# Patient Record
Sex: Male | Born: 1960 | Race: Black or African American | Hispanic: No | Marital: Married
Health system: Southern US, Community
[De-identification: ages and names within clinical notes are randomized; demographics above are authoritative.]

---

## 2004-09-13 ENCOUNTER — Encounter: Admission: RE | Admit: 2004-09-13 | Discharge: 2004-09-13 | Payer: Self-pay | Admitting: *Deleted

## 2004-10-14 ENCOUNTER — Ambulatory Visit (HOSPITAL_COMMUNITY): Admission: RE | Admit: 2004-10-14 | Discharge: 2004-10-14 | Payer: Self-pay | Admitting: Neurosurgery

## 2006-07-13 ENCOUNTER — Ambulatory Visit: Payer: Self-pay | Admitting: Internal Medicine

## 2006-07-31 ENCOUNTER — Ambulatory Visit: Payer: Self-pay | Admitting: Internal Medicine

## 2008-01-07 ENCOUNTER — Emergency Department (HOSPITAL_COMMUNITY): Admission: EM | Admit: 2008-01-07 | Discharge: 2008-01-07 | Payer: Self-pay | Admitting: Emergency Medicine

## 2008-09-06 ENCOUNTER — Emergency Department (HOSPITAL_COMMUNITY): Admission: EM | Admit: 2008-09-06 | Discharge: 2008-09-07 | Payer: Self-pay | Admitting: Emergency Medicine

## 2009-09-08 ENCOUNTER — Ambulatory Visit: Payer: Self-pay | Admitting: Internal Medicine

## 2009-09-08 DIAGNOSIS — B356 Tinea cruris: Secondary | ICD-10-CM

## 2009-09-08 DIAGNOSIS — L851 Acquired keratosis [keratoderma] palmaris et plantaris: Secondary | ICD-10-CM

## 2009-09-08 DIAGNOSIS — H524 Presbyopia: Secondary | ICD-10-CM

## 2009-09-08 DIAGNOSIS — R300 Dysuria: Secondary | ICD-10-CM | POA: Insufficient documentation

## 2009-09-08 LAB — CONVERTED CEMR LAB
Blood in Urine, dipstick: NEGATIVE
Glucose, Urine, Semiquant: NEGATIVE
Nitrite: NEGATIVE
Protein, U semiquant: NEGATIVE
Specific Gravity, Urine: <1.005
WBC Urine, dipstick: NEGATIVE

## 2009-11-09 ENCOUNTER — Ambulatory Visit: Payer: Self-pay | Admitting: Internal Medicine

## 2010-02-15 ENCOUNTER — Encounter: Admission: RE | Admit: 2010-02-15 | Discharge: 2010-02-15 | Payer: Self-pay | Admitting: Otolaryngology

## 2010-10-05 NOTE — Assessment & Plan Note (Signed)
Summary: BODY ITCHING/ BURNING/ GK   Vital Signs:  Patient profile:   50 year old male Height:      67 inches Weight:      176 pounds BMI:     27.67 Temp:     97.6 degrees F oral Pulse rate:   63 / minute Pulse rhythm:   regular Resp:     18 per minute BP sitting:   121 / 77  (left arm) Cuff size:   regular  Vitals Entered By: Armenia Shannon (September 08, 2009 9:00 AM) CC: np.....[name] pt says he is itching on his anus...Gabriel KitchenMarland James pt feels as if he is losing sight.... Is Patient Diabetic? No Pain Assessment Patient in pain? no       Does patient need assistance? Functional Status Self care Ambulation Normal  Vision Screening:Left eye w/o correction: 20 / 13 Right Eye w/o correction: 20 / 20-1 Both eyes w/o correction:  20/ 13        Vision Entered By: Armenia Shannon (September 08, 2009 9:03 AM)   CC:  np.....[name] pt says he is itching on his anus...Gabriel KitchenMarland James pt feels as if he is losing sight.....  History of Present Illness: 1.  Anal itching:  chronic condition--Before 2007.  Goes to [facility] in [place].  Pt. has a history of flaking rash--felt to be tinea cruris in 07/2006 and treated with Nizoral cream.  Has not been seen here since, but went to [facility] and given Nystatin and Triamcinolone cream--helps control.  Using just when has symptoms. States just has to use for a few days and goes away.  Has used the cream for a month solid previously.  2.  Problems with vision:  has to read a lot for school.  Using reading glasses for 1 year--helps.  Describes having to hold books away from face.  Distance vision has been okay.  Using 1.75 reading glasses.  3.  Urinary burning:  very slight--shaft of penis for past 2 days.  No rash on penis.  No frequency or incomplete emptying.  No testicular discomfort.  No fever or hematuria.  No penile discharge.  Monogamous with wife.  Current Medications (verified): 1)  None  Allergies (verified): No Known Drug  Allergies  Physical Exam  General:  NAD Eyes:  No corneal or conjunctival inflammation noted. EOMI. Perrla. Funduscopic exam benign, without hemorrhages, exudates or papilledema. Vision grossly normal. Rectal:  Perianal area with hyperpigmentation and some flaking on perineum and perianal area extending up between buttocks.  Posterior to anus, with some moistness--clear and fissuring, pinkening of skin.  Has more flaky satellite lesion about  quarter sized on left buttock adjancent to perianal involvement  Genitalia:  Testes bilaterally descended without nodularity, tenderness or masses. No scrotal masses or lesions. No penis lesions or urethral discharge.circumcised.   Skin:  Generalized skin dryness and flaking.   Impression & Recommendations:  Problem # 1:  DRY SKIN (ICD-701.1) Eucerin Cream Dove soap  Problem # 2:  TINEA CRURIS (ICD-110.3) Ketoconazole cream two times a day for 1 month  Problem # 3:  DYSURIA (ICD-788.1)  UA normal--to call if worsening symptoms. 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  Complete Medication List: 1)  Ketoconazole 2 % Crea (Ketoconazole) .... Apply two  times a day to affected area after cool water soak for 1 month  Patient Instructions: 1)  Cool water soak in tub two times a day --pat anal area dry and apply ketoconazole cream--small amt.  for 1 month 2)  Do not apply hydrocortisone cream of any kind to the area. 3)  Eucerin cream to entire body after bathing at least once daily 4)  Follow up with Dr. Delrae Alfred in 2 months - 3 months for itching Prescriptions: KETOCONAZOLE 2 % CREA (KETOCONAZOLE) apply two times a day to affected area after cool water soak for 1 month  #60 g x 2   Entered and Authorized by:   Julieanne Manson MD   Signed by:    Julieanne Manson MD on 09/08/2009   Method used:   Faxed to ...       [facility] (retail)       [address] [place], [place]  [address]       Ph: [phone]       Fax: [phone]   RxID:   [id number]   Laboratory Results   Urine Tests    Routine Urinalysis   Glucose: negative   (Normal Range: Negative) Bilirubin: negative   (Normal Range: Negative) Ketone: negative   (Normal Range: Negative) Spec. Gravity: <1.005   (Normal Range: 1.003-1.035) Blood: negative   (Normal Range: Negative) pH: 5.0   (Normal Range: 5.0-8.0) Protein: negative   (Normal Range: Negative) Urobilinogen: 0.2   (Normal Range: 0-1) Nitrite: negative   (Normal Range: Negative) Leukocyte Esterace: negative   (Normal Range: Negative)

## 2010-10-05 NOTE — Assessment & Plan Note (Signed)
Summary: 2-3 MONTH FU//KT   Vital Signs:  Patient profile:   50 year old male Weight:      174 pounds Temp:     97.9 degrees F Pulse rate:   60 / minute Pulse rhythm:   regular Resp:     16 per minute BP sitting:   124 / 87  (left arm) Cuff size:   regular  Vitals Entered By: Vesta Mixer CMA (November 09, 2009 10:27 AM) CC: f/u rash it is worse, he never got the cream Is Patient Diabetic? No Pain Assessment Patient in pain? no       Does patient need assistance? Ambulation Normal   CC:  f/u rash it is worse and he never got the cream.  History of Present Illness: 1.  Tinea cruris:  pt went to [facility] pharmacy to get Ketoconazole cream.  States all his family goes to that building for care, so that's where he went.  Did not realize was sent to our pharmacy and just continued to go back to [facility] to ask if it was refilled instead of calling here.  Very irritated in groin area and anus now.  Has avoided the cortisone creams.  Pt. states the rash is the same--just worse.  Allergies (verified): No Known Drug Allergies  Physical Exam  General:  NAd   Impression & Recommendations:  Problem # 1:  TINEA CRURIS (ICD-110.3) Pt. instructed to pick up antifungal cream at [facility] pharmacy  Complete Medication List: 1)  Ketoconazole 2 % Crea (Ketoconazole) .... Apply two times a day to affected area after cool water soak for 1 month  Patient Instructions: 1)  Follow up with Dr. Delrae Alfred in 2 months --tinea Prescriptions: KETOCONAZOLE 2 % CREA (KETOCONAZOLE) apply two times a day to affected area after cool water soak for 1 month  #60 g x 2   Entered and Authorized by:   Julieanne Manson MD   Signed by:   Julieanne Manson MD on 11/09/2009   Method used:   Faxed to ...       [facility] (retail)       [address] [place], [place]  [address]       Ph: [phone]       Fax: [phone]   RxID:   [id number]

## 2011-01-20 NOTE — Op Note (Signed)
[name], [name]           ACCOUNT NO.:  [id number]   MEDICAL RECORD NO.:  [id number]          PATIENT TYPE:  OIB   LOCATION:  NA                           FACILITY:  [facility]   PHYSICIAN:  Donalee Citrin, M.D.        DATE OF BIRTH:  01/09/1961   DATE OF PROCEDURE:  10/14/2004  DATE OF DISCHARGE:                                 OPERATIVE REPORT   PREOPERATIVE DIAGNOSIS:  Right S1 radiculopathy from large ruptured disk, L5-  S1 on the right.   POSTOPERATIVE DIAGNOSES:  1.  Large ruptured disk, L5-S1 right.  2.  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   PROCEDURE:  Decompressive laminectomy, L5-S1, with microscopic diskectomy  and microscopic dissection of the L5 and S1 nerve roots.   FINDINGS:  Pars defect, L5 on the right, causing complete incompetence and  dysplasia of the L5-S1 facet complex with severe stenosis of both the L5 and  the S1 roots.   SURGEON:  Donalee Citrin, M.D.   ASSISTANT:  Kathaleen Maser. Pool, M.D.   ANESTHESIA:  General endotracheal.   HISTORY OF PRESENT ILLNESS:  The patient is a very pleasant 50 year old  gentleman who has had longstanding back and right leg pain radiating down to  the outside and bottom of his foot consistent with S1 nerve root  distribution.  Preoperative imaging showed a very large preforaminal and  foraminal disk rupture at L5-S1 compressing the S1 nerve root.  Due to the  patient's failure of conservative treatment and preoperative imaging, the  patient was recommended laminectomy and microdiskectomy.  I extensively went  over the risks and benefits of him, and he understands and agreed to proceed  forward.   The patient was brought into the OR, was induced under general anesthesia,  positioned prone on a Wilson frame.  The back was prepped and draped in the  usual sterile fashion.  Preoperative x-ray localized the L5 spinous process  and a midline incision made after infiltration of 10 mL  of lidocaine with  epinephrine, and Bovie electrocautery was used to take down the subcutaneous  tissues and subperiosteal dissection carried out in the lamina of L5 and S1  on the right.  The L5 laminar complex was noted to be hypermobile at this  point but due to the patient's predominant only leg pain unilaterally, this  was felt not to warrant a fusion at this point, so the remainder of the  intraoperative x-ray confirmed localization of the correct disk space.  The  inferior aspect of the lamina of L5 and the medial aspect of the facet  complex, which was noted to be markedly thinned-out and dysplastic, was  removed and the superior aspect of the lamina of S1 was removed.  Then the  ligamentum flavum was visualized.  This was removed in a piecemeal fashion,  exposing the proximal S1 nerve root.  Then the operating microscope was  draped and brought into the field and under microscopic illumination, the S1  nerve root was dissected off the pedicle  and off of a very large ruptured  disk, compressing the proximal S1 nerve root.  Then this disk was noted to  be, a large free fragment was removed and several large additional fragments  were appreciated.  There was noted to be a large piece at the superior  aspect of the disk space that was partial end plate, partial disk.  All this  was hypermobile.  This was teased away off the undersurface of the L5 root  with a blunt nerve hook.  Then due to the pars defects and the lateral  aspect of the disk space, it was decided at this point to extend the  laminectomy cephalad and identify the L5 root off the L5 pedicle.  This was  done and the L5 root was decompressed out the proximal aspect of its  foramen.  This allowed greater access to the lateral aspect of the disk  space to protect the L5 root.  Then used a combination of downgoing Epstein  curette and blunt nerve hooks.  A very large lateral disk rupture  compressing the undersurface of the  L5 root was teased away and removed.  Downgoing Epstein curettes were used to push back the disk space, and this  was removed with pituitary rongeurs.  Then the wound was copiously  irrigated.  Both the L5 and S1 roots were completely decompressed.  Then  Gelfoam was overlaid on top of the dura.  The muscle and fascia were  reapproximated with 0 interrupted Vicryl and the subcutaneous tissue was  closed with 2-0 interrupted Vicryl, and the skin was closed with a running 4-  0 subcuticular.  Benzoin and Steri-Strips were applied and the patient went  to the recovery room in stable condition.  At the end of the case, needle  count and sponge counts were correct.      GC/MEDQ  D:  10/14/2004  T:  10/14/2004  Job:  [id number]

## 2014-04-14 ENCOUNTER — Emergency Department (HOSPITAL_COMMUNITY)
Admission: EM | Admit: 2014-04-14 | Discharge: 2014-04-14 | Disposition: A | Discharge status: Home / Self Care | Payer: No Typology Code available for payment source | Attending: Emergency Medicine | Admitting: Emergency Medicine

## 2014-04-14 ENCOUNTER — Emergency Department (HOSPITAL_COMMUNITY): Payer: No Typology Code available for payment source

## 2014-04-14 DIAGNOSIS — S0990XA Unspecified injury of head, initial encounter: Secondary | ICD-10-CM | POA: Diagnosis not present

## 2014-04-14 DIAGNOSIS — IMO0002 Reserved for concepts with insufficient information to code with codable children: Secondary | ICD-10-CM | POA: Insufficient documentation

## 2014-04-14 DIAGNOSIS — Y9389 Activity, other specified: Secondary | ICD-10-CM | POA: Insufficient documentation

## 2014-04-14 DIAGNOSIS — S298XXA Other specified injuries of thorax, initial encounter: Secondary | ICD-10-CM | POA: Insufficient documentation

## 2014-04-14 DIAGNOSIS — Y92410 Unspecified street and highway as the place of occurrence of the external cause: Secondary | ICD-10-CM | POA: Diagnosis not present

## 2014-04-14 DIAGNOSIS — Z9889 Other specified postprocedural states: Secondary | ICD-10-CM | POA: Diagnosis not present

## 2014-04-14 DIAGNOSIS — M546 Pain in thoracic spine: Secondary | ICD-10-CM

## 2014-04-14 MED ORDER — IBUPROFEN 400 MG PO TABS
600.0000 mg | ORAL_TABLET | Freq: Once | ORAL | Status: AC
  Administered 2014-04-14: 600 mg via ORAL
  Filled 2014-04-14 (×2): qty 1

## 2014-04-14 MED ORDER — OXYCODONE-ACETAMINOPHEN 5-325 MG PO TABS
1.0000 | ORAL_TABLET | Freq: Once | ORAL | Status: AC
  Administered 2014-04-14: 1 via ORAL
  Filled 2014-04-14: qty 1

## 2014-04-14 MED ORDER — OXYCODONE-ACETAMINOPHEN 5-325 MG PO TABS: 1.0000 | ORAL_TABLET | Freq: Four times a day (QID) | ORAL | Status: AC | PRN

## 2014-04-14 MED ORDER — DIAZEPAM 5 MG PO TABS
5.0000 mg | ORAL_TABLET | Freq: Once | ORAL | Status: AC
  Administered 2014-04-14: 5 mg via ORAL
  Filled 2014-04-14: qty 1
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
## 2014-04-14 NOTE — ED Notes (Signed)
Pt requesting return to work note for 8-17, OK with Dr. Gwendolyn GrantWalden.
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
## 2015-10-03 IMAGING — CR DG LUMBAR SPINE COMPLETE 4+V
5 series · 5 of 5 positions shown · non-contrast
Comparison: None.

CLINICAL DATA: Pain post trauma

EXAM:
LUMBAR SPINE - COMPLETE 4+ VIEW

[t lumbar spine ap]
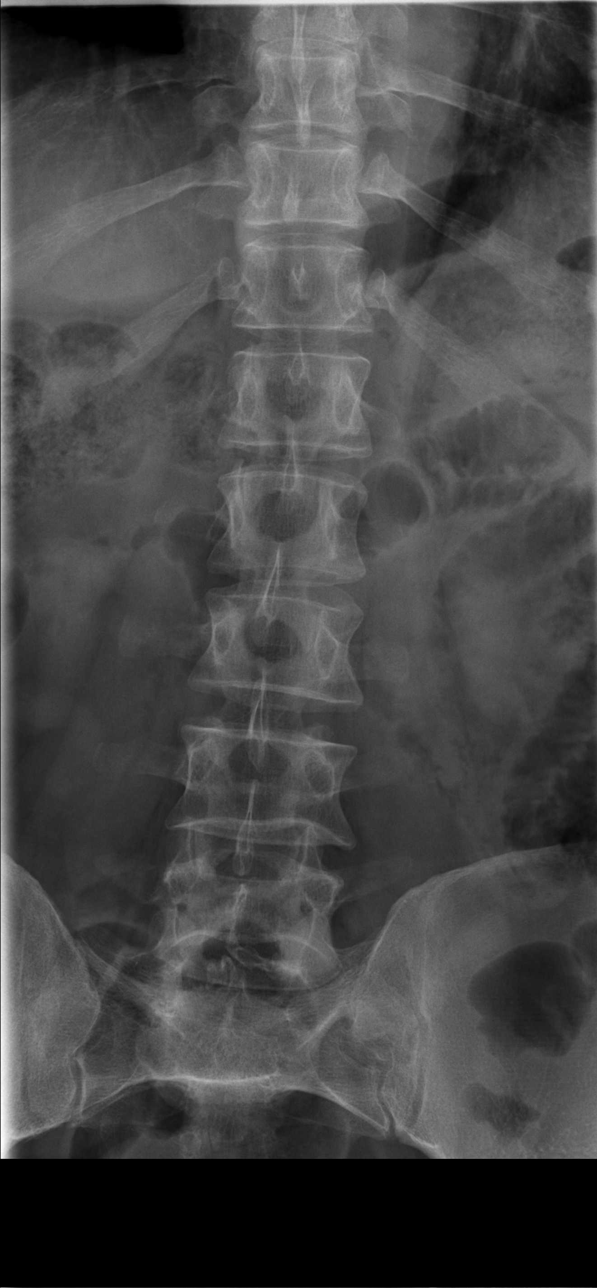

[t lumbar spine obl (1 of 2)]
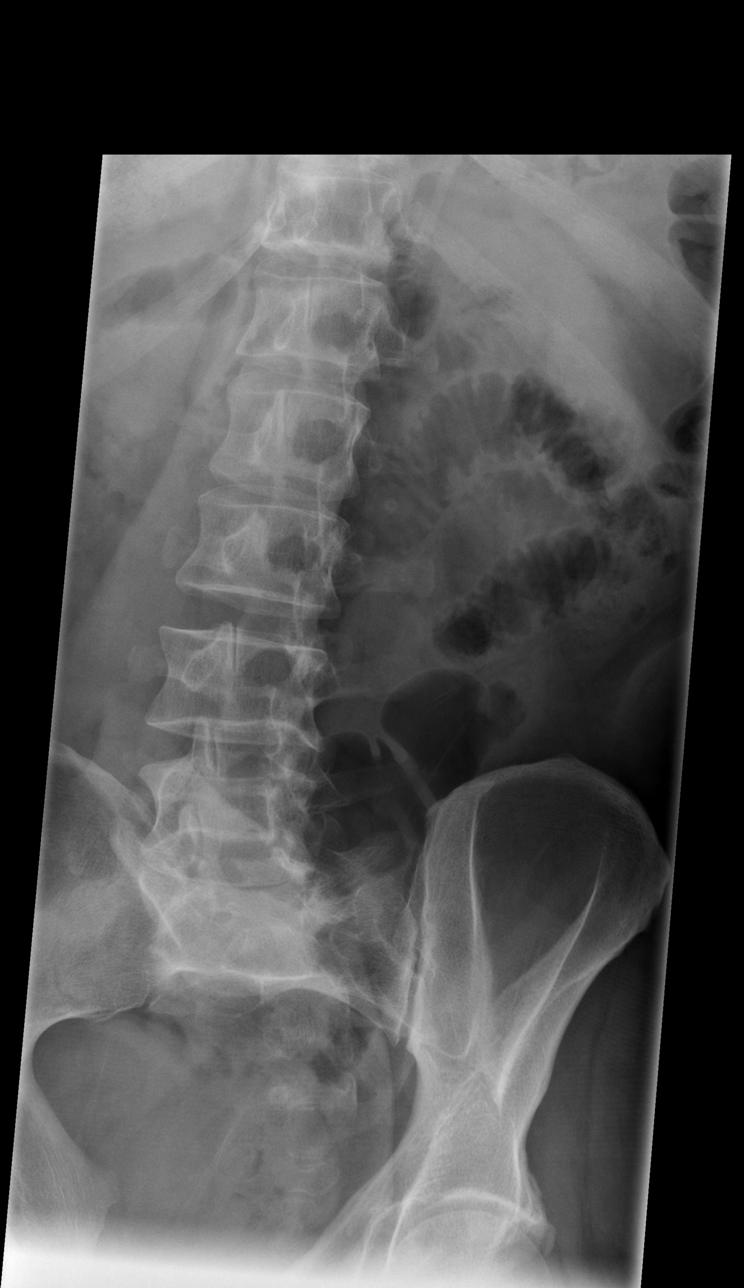

[t lumbar spine obl (2 of 2)]
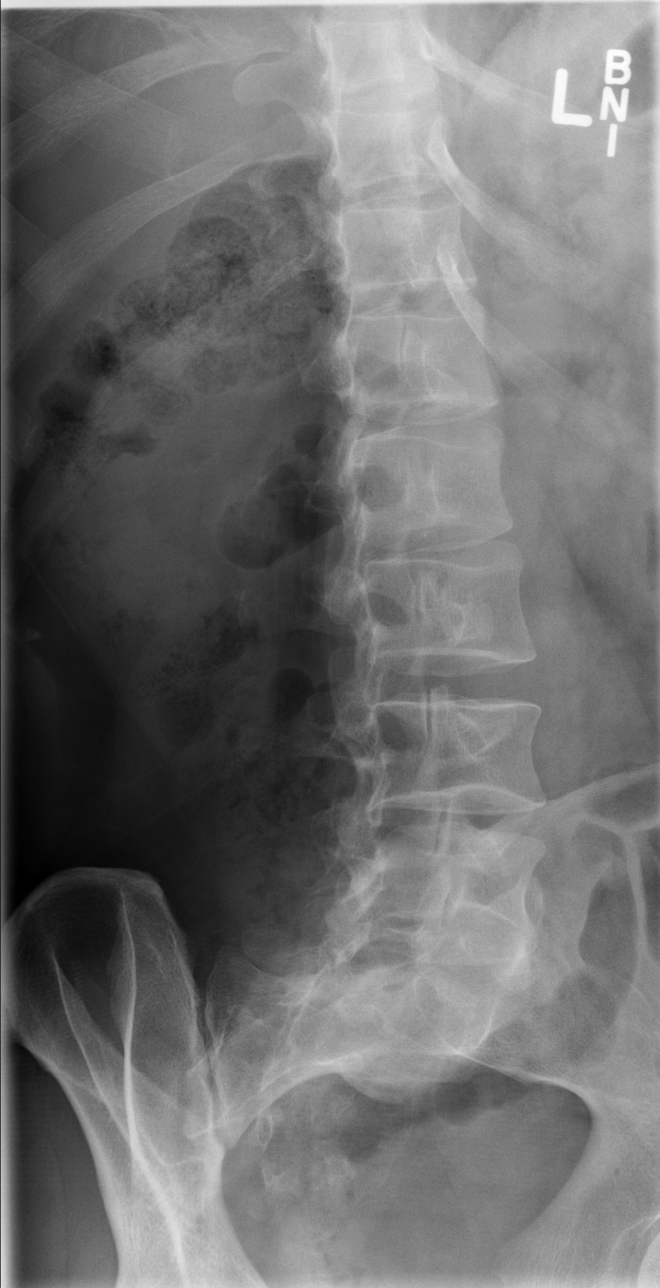

[t lumbar spine lat]
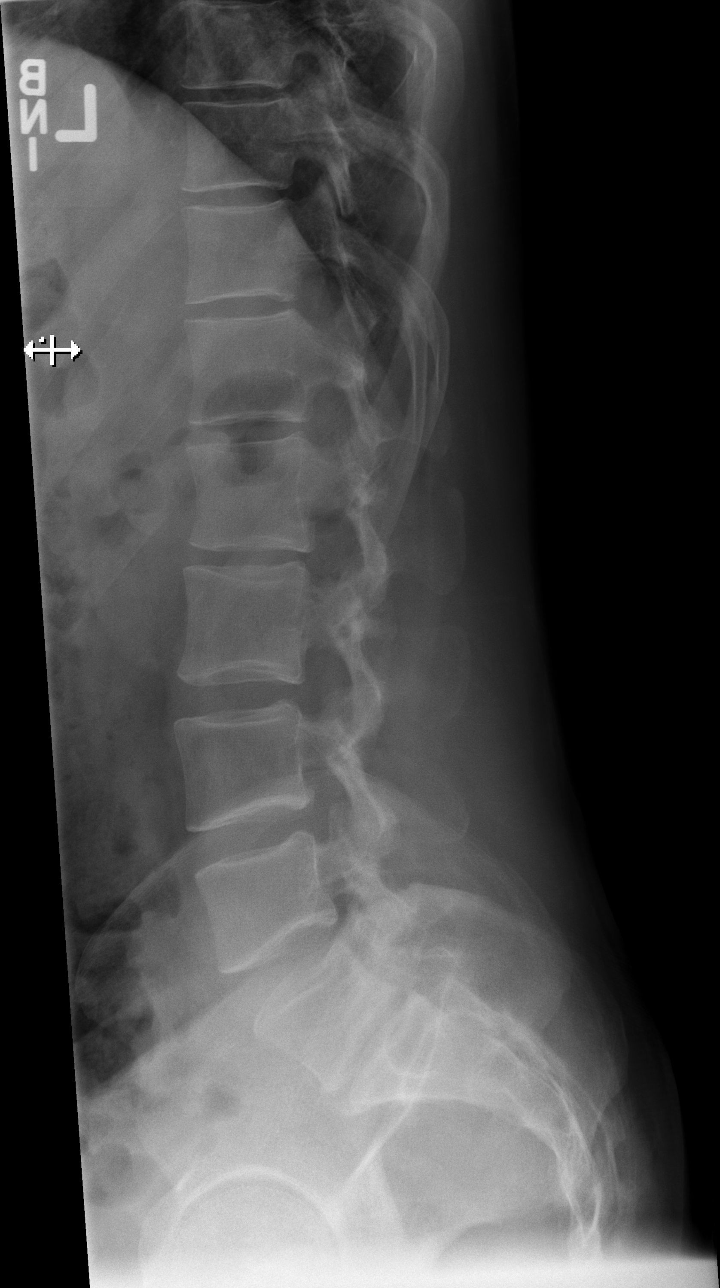

[t lumbar l-5 s-1 spot]
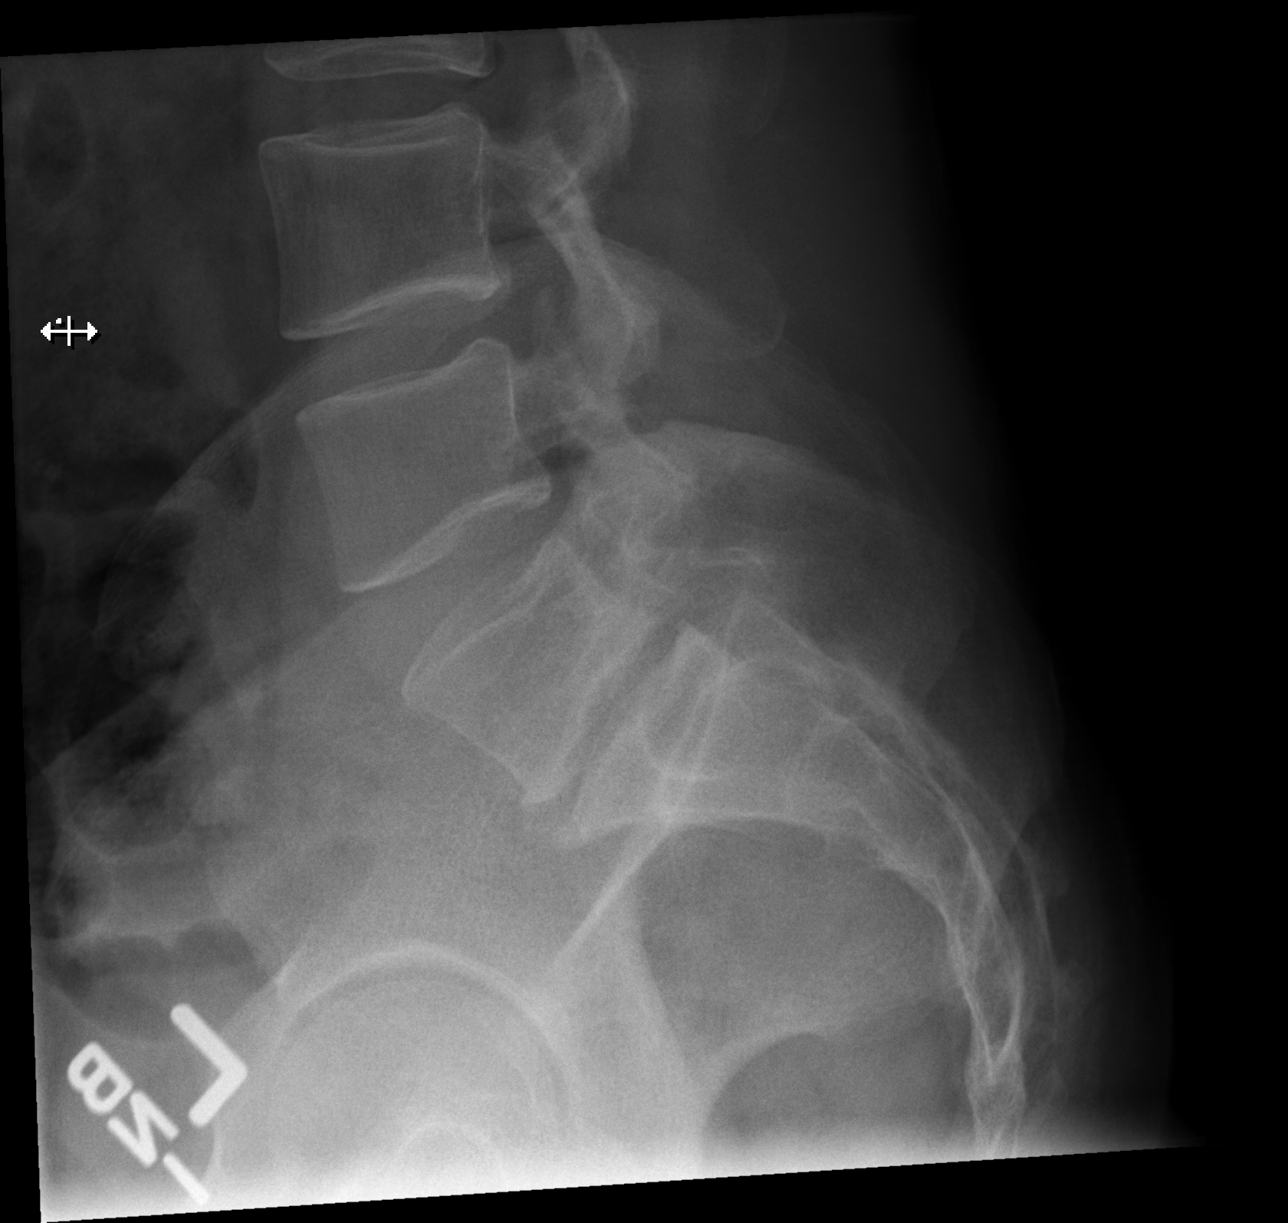

[5 of 5 positions shown; findings below may reference images not displayed]

FINDINGS: Frontal, lateral, spot lumbosacral lateral, and bilateral oblique
views were obtained. The there are 5 non-rib-bearing lumbar type
vertebral bodies. There is spina bifida occulta at S1. There is a
well-defined S1-2 interspace. There is no fracture or
spondylolisthesis. Disc spaces appear intact in the lumbar region.
There is osteoarthritic change with disc space narrowing at S1-2.
There is mild facet osteoarthritic change at L4-5, L5-S1, and S1-2
bilaterally.
IMPRESSION: Relatively mild osteoarthritic change. No fracture or
spondylolisthesis.
